# Patient Record
Sex: Male | Born: 1995 | Hispanic: No | Marital: Single | Smoking: Never smoker
Health system: Southern US, Community
[De-identification: ages and names within clinical notes are randomized; demographics above are authoritative.]

## PROBLEM LIST (undated history)

## (undated) ENCOUNTER — Emergency Department (HOSPITAL_COMMUNITY): Admission: EM | Payer: Medicaid Other | Source: Home / Self Care

## (undated) HISTORY — PX: HERNIA REPAIR: SHX51

## (undated) HISTORY — PX: WISDOM TOOTH EXTRACTION: SHX21

---

## 2014-04-02 ENCOUNTER — Emergency Department (HOSPITAL_COMMUNITY)
Admission: EM | Admit: 2014-04-02 | Discharge: 2014-04-02 | Disposition: A | Discharge status: Home / Self Care | Payer: Medicaid Other | Attending: Emergency Medicine | Admitting: Emergency Medicine

## 2014-04-02 ENCOUNTER — Encounter (HOSPITAL_COMMUNITY): Payer: Self-pay | Admitting: Emergency Medicine

## 2014-04-02 DIAGNOSIS — IMO0002 Reserved for concepts with insufficient information to code with codable children: Secondary | ICD-10-CM | POA: Diagnosis not present

## 2014-04-02 DIAGNOSIS — J029 Acute pharyngitis, unspecified: Secondary | ICD-10-CM | POA: Diagnosis not present

## 2014-04-02 DIAGNOSIS — Z79899 Other long term (current) drug therapy: Secondary | ICD-10-CM | POA: Insufficient documentation

## 2014-04-02 LAB — RAPID STREP SCREEN (MED CTR MEBANE ONLY): Streptococcus, Group A Screen (Direct): NEGATIVE

## 2014-04-02 MED ORDER — CLINDAMYCIN HCL 300 MG PO CAPS: 300.0000 mg | ORAL_CAPSULE | Freq: Four times a day (QID) | ORAL | Status: DC

## 2014-04-02 NOTE — ED Provider Notes (Signed)
CSN: [id number]     Arrival date & time 04/02/14  1840 History   None  This chart was scribed for non-physician practitioner Ivonne Andrew PA-C working with Raeford Razor, MD by Gwenevere Abbot, ED scribe. This patient was seen in room WTR6/WTR6 and the patient's care was started at 8:38 PM.    Chief Complaint  Patient presents with  . Sore Throat   HPI HPI Comments:  Colin Martinez is a 18 y.o. male who presents to the Emergency Department complaining of  sore throat, with associated symptoms of cold sweats, cough, ever, and chills, onset 2 days. Pt reports that he has had mono twice, and that he was tested and diagnosed. Pt reports that he has had a lot of issues with his throat. He states that after his wisdom teeth were pulled, his throat became infected. He also reports having trouble with adenoids. Pt states that he has visited HENT specialist and that they said everything was fine.    History reviewed. No pertinent past medical history. Past Surgical History  Procedure Laterality Date  . Hernia repair     No family history on file. History  Substance Use Topics  . Smoking status: Never Smoker   . Smokeless tobacco: Not on file  . Alcohol Use: Yes    Review of Systems  Constitutional: Positive for fever and chills.  HENT: Positive for sore throat. Negative for congestion and rhinorrhea.   Respiratory: Positive for cough.   Gastrointestinal: Negative for nausea, vomiting, diarrhea and constipation.  All other systems reviewed and are negative.     Allergies  Wheat bran  Home Medications   Prior to Admission medications   Medication Sig Start Date End Date Taking? Authorizing Provider  diphenhydrAMINE (BENADRYL) 12.5 MG/5ML liquid Take 25 mg by mouth 4 (four) times daily as needed for itching or allergies.   Yes Historical Provider, MD  ibuprofen (ADVIL,MOTRIN) 200 MG tablet Take 400 mg by mouth every 6 (six) hours as needed for moderate pain.   Yes Historical  Provider, MD  predniSONE (DELTASONE) 20 MG tablet Take 20-40 mg by mouth daily with breakfast. 40 mg daily for 3 days  Then 20 mg daily for 3 days then 10 mg daily for 2 day s    Historical Provider, MD  tobramycin (TOBREX) 0.3 % ophthalmic solution Place 2 drops into the right eye every 4 (four) hours.    Historical Provider, MD   BP 124/75  Pulse 98  Temp(Src) 98.9 F (37.2 C) (Oral)  Resp 18  SpO2 100% Physical Exam  Nursing note and vitals reviewed. Constitutional: He is oriented to person, place, and time. He appears well-developed and well-nourished.  HENT:  Head: Normocephalic.  Tonsils are enlarged with exudate. There is exudate to the posterior pharynx.   Neck: Normal range of motion. Neck supple.  Cardiovascular: Normal rate and regular rhythm.   Pulmonary/Chest: Effort normal and breath sounds normal.  Abdominal: Soft.  Lymphadenopathy:    He has cervical adenopathy.  Neurological: He is alert and oriented to person, place, and time.  Skin: Skin is warm.  Psychiatric: He has a normal mood and affect. His behavior is normal.    ED Course  Procedures  DIAGNOSTIC STUDIES: Oxygen Saturation is 100% on RA, normal by my interpretation.  COORDINATION OF CARE: 8:28 PM-Discussed treatment plan with pt at bedside and pt agreed to plan.  Results for orders placed during the hospital encounter of 04/02/14  RAPID STREP SCREEN  Result Value Ref Range   Streptococcus, Group A Screen (Direct) NEGATIVE  NEGATIVE    MDM   Final diagnoses:  Pharyngitis   I personally performed the services described in this documentation, which was scribed in my presence. The recorded information has been reviewed and is accurate.      Angus Seller, PA-C 04/02/14 2109

## 2014-04-02 NOTE — Discharge Instructions (Signed)
Please follow up with a primary care provider or Ear, Nose and throat specialist for further evaluation and treatment.     Pharyngitis Pharyngitis is a sore throat (pharynx). There is redness, pain, and swelling of your throat. HOME CARE   Drink enough fluids to keep your pee (urine) clear or pale yellow.  Only take medicine as told by your doctor.  You may get sick again if you do not take medicine as told. Finish your medicines, even if you start to feel better.  Do not take aspirin.  Rest.  Rinse your mouth (gargle) with salt water ( tsp of salt per 1 qt of water) every 1-2 hours. This will help the pain.  If you are not at risk for choking, you can suck on hard candy or sore throat lozenges. GET HELP IF:  You have large, tender lumps on your neck.  You have a rash.  You cough up green, yellow-brown, or bloody spit. GET HELP RIGHT AWAY IF:   You have a stiff neck.  You drool or cannot swallow liquids.  You throw up (vomit) or are not able to keep medicine or liquids down.  You have very bad pain that does not go away with medicine.  You have problems breathing (not from a stuffy nose). MAKE SURE YOU:   Understand these instructions.  Will watch your condition.  Will get help right away if you are not doing well or get worse. Document Released: 01/10/2008 Document Revised: 05/14/2013 Document Reviewed: 03/31/2013 [facility] Patient Information 2015 [place], [place]. This information is not intended to replace advice given to you by your health care provider. Make sure you discuss any questions you have with your health care provider.

## 2014-04-02 NOTE — ED Notes (Signed)
Pt reports sore throat and headache x 2 weeks and body aches that started yesterday. "i have had cold chills and i was unable to get out of the bed yesterday". Positive for a cough. Denies n/v/d. Pt has swollen tonsils and a red throat. NAD

## 2014-04-03 NOTE — ED Provider Notes (Signed)
.  Medical screening examination/treatment/procedure(s) were performed by non-physician practitioner and as supervising physician I was immediately available for consultation/collaboration.   EKG Interpretation None       Raeford Razor, MD 04/03/14 [phone]

## 2014-04-04 LAB — CULTURE, GROUP A STREP

## 2014-12-01 ENCOUNTER — Emergency Department (INDEPENDENT_AMBULATORY_CARE_PROVIDER_SITE_OTHER)
Admission: EM | Admit: 2014-12-01 | Discharge: 2014-12-01 | Disposition: A | Discharge status: Home / Self Care | Payer: Medicaid Other | Source: Home / Self Care

## 2014-12-01 ENCOUNTER — Encounter (HOSPITAL_COMMUNITY): Payer: Self-pay | Admitting: Emergency Medicine

## 2014-12-01 ENCOUNTER — Other Ambulatory Visit (HOSPITAL_COMMUNITY)
Admission: RE | Admit: 2014-12-01 | Discharge: 2014-12-01 | Disposition: A | Discharge status: Home / Self Care | Payer: Medicaid Other | Source: Ambulatory Visit | Attending: Family Medicine | Admitting: Family Medicine

## 2014-12-01 DIAGNOSIS — Z202 Contact with and (suspected) exposure to infections with a predominantly sexual mode of transmission: Secondary | ICD-10-CM

## 2014-12-01 DIAGNOSIS — Z113 Encounter for screening for infections with a predominantly sexual mode of transmission: Secondary | ICD-10-CM | POA: Diagnosis present

## 2014-12-01 DIAGNOSIS — Z711 Person with feared health complaint in whom no diagnosis is made: Secondary | ICD-10-CM

## 2014-12-01 NOTE — Discharge Instructions (Signed)
We will call with test results and treat as indicated °

## 2014-12-01 NOTE — ED Provider Notes (Signed)
CSN: [id number]     Arrival date & time 12/01/14  1250 History   None    Chief Complaint  Patient presents with  . Exposure to STD   (Consider location/radiation/quality/duration/timing/severity/associated sxs/prior Treatment) Patient is a 19 y.o. male presenting with STD exposure. The history is provided by the patient.  Exposure to STD This is a new problem. The current episode started yesterday (told yest by girl that she had chlamydia, pt asx and never had any problems prev, here for check.). The problem has not changed since onset.Pertinent negatives include no abdominal pain.    History reviewed. No pertinent past medical history. Past Surgical History  Procedure Laterality Date  . Hernia repair    . Wisdom tooth extraction     History reviewed. No pertinent family history. History  Substance Use Topics  . Smoking status: Never Smoker   . Smokeless tobacco: Never Used  . Alcohol Use: Yes     Comment: social    Review of Systems  Constitutional: Negative.   Gastrointestinal: Negative for abdominal pain.  Genitourinary: Negative for dysuria, discharge, penile swelling, scrotal swelling, penile pain and testicular pain.    Allergies  Wheat bran  Home Medications   Prior to Admission medications   Medication Sig Start Date End Date Taking? Authorizing Provider  clindamycin (CLEOCIN) 300 MG capsule Take 1 capsule (300 mg total) by mouth 4 (four) times daily. X 7 days 04/02/14   Ivonne AndrewPeter Dammen, PA-C  diphenhydrAMINE (BENADRYL) 12.5 MG/5ML liquid Take 25 mg by mouth 4 (four) times daily as needed for itching or allergies.    Historical Provider, MD  ibuprofen (ADVIL,MOTRIN) 200 MG tablet Take 400 mg by mouth every 6 (six) hours as needed for moderate pain.    Historical Provider, MD  predniSONE (DELTASONE) 20 MG tablet Take 20-40 mg by mouth daily with breakfast. 40 mg daily for 3 days  Then 20 mg daily for 3 days then 10 mg daily for 2 day s    Historical Provider, MD   tobramycin (TOBREX) 0.3 % ophthalmic solution Place 2 drops into the right eye every 4 (four) hours.    Historical Provider, MD   BP 131/84 mmHg  Pulse 72  Temp(Src) 98.5 F (36.9 C) (Oral)  SpO2 98% Physical Exam  Constitutional: He is oriented to person, place, and time. He appears well-developed and well-nourished. No distress.  Genitourinary: Penis normal.  Neurological: He is alert and oriented to person, place, and time.  Skin: Skin is warm and dry.  Nursing note and vitals reviewed.   ED Course  Procedures (including critical care time) Labs Review Labs Reviewed  URINE CYTOLOGY ANCILLARY ONLY    Imaging Review No results found.   MDM   1. Concern about STD in male without diagnosis    Pt desires to wait for test results before treatment,    Linna HoffJames D Princess Karnes, MD 12/01/14 [id number]

## 2014-12-01 NOTE — ED Notes (Signed)
Pt was told by his partner that she had Chlamydia.  He is here today to be tested.  He has no symptoms at this time, but he does have what appears to be a cold sore on his left upper lip.

## 2014-12-02 LAB — URINE CYTOLOGY ANCILLARY ONLY
Chlamydia: NEGATIVE
Neisseria Gonorrhea: NEGATIVE
TRICH (WINDOWPATH): NEGATIVE

## 2015-09-30 ENCOUNTER — Emergency Department (HOSPITAL_COMMUNITY)
Admission: EM | Admit: 2015-09-30 | Discharge: 2015-09-30 | Disposition: A | Discharge status: Home / Self Care | Payer: Medicaid Other | Attending: Emergency Medicine | Admitting: Emergency Medicine

## 2015-09-30 ENCOUNTER — Encounter (HOSPITAL_COMMUNITY): Payer: Self-pay | Admitting: *Deleted

## 2015-09-30 ENCOUNTER — Emergency Department (HOSPITAL_COMMUNITY): Payer: Medicaid Other

## 2015-09-30 DIAGNOSIS — B349 Viral infection, unspecified: Secondary | ICD-10-CM | POA: Diagnosis not present

## 2015-09-30 DIAGNOSIS — R509 Fever, unspecified: Secondary | ICD-10-CM | POA: Diagnosis present

## 2015-09-30 DIAGNOSIS — R000 Tachycardia, unspecified: Secondary | ICD-10-CM | POA: Insufficient documentation

## 2015-09-30 DIAGNOSIS — R6889 Other general symptoms and signs: Secondary | ICD-10-CM

## 2015-09-30 DIAGNOSIS — Z792 Long term (current) use of antibiotics: Secondary | ICD-10-CM | POA: Insufficient documentation

## 2015-09-30 DIAGNOSIS — R231 Pallor: Secondary | ICD-10-CM | POA: Insufficient documentation

## 2015-09-30 LAB — RAPID STREP SCREEN (MED CTR MEBANE ONLY): STREPTOCOCCUS, GROUP A SCREEN (DIRECT): NEGATIVE

## 2015-09-30 LAB — I-STAT CG4 LACTIC ACID, ED: Lactic Acid, Venous: 0.89 mmol/L (ref 0.5–2.0)

## 2015-09-30 MED ORDER — OSELTAMIVIR PHOSPHATE 75 MG PO CAPS: 75.0000 mg | ORAL_CAPSULE | Freq: Two times a day (BID) | ORAL | Status: DC

## 2015-09-30 MED ORDER — ONDANSETRON 4 MG PO TBDP: 4.0000 mg | ORAL_TABLET | Freq: Once | ORAL | Status: DC

## 2015-09-30 MED ORDER — ACETAMINOPHEN 325 MG PO TABS: 650.0000 mg | ORAL_TABLET | Freq: Four times a day (QID) | ORAL | Status: DC | PRN

## 2015-09-30 MED ORDER — BENZONATATE 100 MG PO CAPS: 100.0000 mg | ORAL_CAPSULE | Freq: Three times a day (TID) | ORAL | Status: DC

## 2015-09-30 MED ORDER — KETOROLAC TROMETHAMINE 30 MG/ML IJ SOLN
30.0000 mg | Freq: Once | INTRAMUSCULAR | Status: AC
  Administered 2015-09-30: 30 mg via INTRAVENOUS
  Filled 2015-09-30: qty 1

## 2015-09-30 MED ORDER — ACETAMINOPHEN 325 MG PO TABS
650.0000 mg | ORAL_TABLET | Freq: Once | ORAL | Status: AC | PRN
  Administered 2015-09-30: 650 mg via ORAL
  Filled 2015-09-30: qty 2

## 2015-09-30 MED ORDER — ONDANSETRON HCL 4 MG/2ML IJ SOLN
4.0000 mg | Freq: Once | INTRAMUSCULAR | Status: AC
  Administered 2015-09-30: 4 mg via INTRAVENOUS
  Filled 2015-09-30: qty 2

## 2015-09-30 MED ORDER — SODIUM CHLORIDE 0.9 % IV BOLUS (SEPSIS)
1000.0000 mL | Freq: Once | INTRAVENOUS | Status: AC
  Administered 2015-09-30: 1000 mL via INTRAVENOUS

## 2015-09-30 MED ORDER — OSELTAMIVIR PHOSPHATE 75 MG PO CAPS
75.0000 mg | ORAL_CAPSULE | Freq: Once | ORAL | Status: AC
  Administered 2015-09-30: 75 mg via ORAL
  Filled 2015-09-30: qty 1

## 2015-09-30 MED ORDER — IBUPROFEN 800 MG PO TABS: 800.0000 mg | ORAL_TABLET | Freq: Three times a day (TID) | ORAL | Status: DC

## 2015-09-30 NOTE — ED Notes (Signed)
Patient transported to X-ray 

## 2015-09-30 NOTE — ED Provider Notes (Signed)
CSN: [id number]     Arrival date & time 09/30/15  1428 History  By signing my name below, I, Freida Busman, attest that this documentation has been prepared under the direction and in the presence of non-physician practitioner, Noelle Penner, PA-C. Electronically Signed: Freida Busman, Scribe. 09/30/2015. 5:51 PM.    Chief Complaint  Patient presents with  . Sore Throat  . Fever  . Cough  . Emesis   The history is provided by the patient. No language interpreter was used.    HPI Comments:  YOSHIO SELIGA is a 20 y.o. male who presents to the Emergency Department complaining of gradually worsening sore throat since yesterday AM. Pt reports associated chills, fever, dry cough, nausea, and HA. Pt has also had two episodes of NBNB emesis. Endorses nausea in the ED now. Denies abdominal pain or diarrhea. He was evaluated from his symptoms at an OSH ED yesterday where he had a negative strep test and was discharged with nausea med and lidocaine mouthwash, which he has taken with little relief. He has also taken tylenol with mild relief of his fever. Pt did not receive the flu shot this season. Denies sick contacts. Denies chest pain or SOB. States he has not been able to tolerate PO today due to nausea.   History reviewed. No pertinent past medical history. Past Surgical History  Procedure Laterality Date  . Hernia repair    . Wisdom tooth extraction     No family history on file. Social History  Substance Use Topics  . Smoking status: Never Smoker   . Smokeless tobacco: Never Used  . Alcohol Use: Yes     Comment: social    Review of Systems  Constitutional: Positive for fever and chills.  HENT: Positive for sore throat.   Respiratory: Positive for cough. Negative for shortness of breath.   Cardiovascular: Negative for chest pain.  Gastrointestinal: Positive for nausea.  Musculoskeletal: Positive for myalgias.  Neurological: Positive for headaches.  All other systems reviewed and  are negative.   Allergies  Wheat bran  Home Medications   Prior to Admission medications   Medication Sig Start Date End Date Taking? Authorizing Provider  clindamycin (CLEOCIN) 300 MG capsule Take 1 capsule (300 mg total) by mouth 4 (four) times daily. X 7 days 04/02/14   Ivonne Andrew, PA-C  diphenhydrAMINE (BENADRYL) 12.5 MG/5ML liquid Take 25 mg by mouth 4 (four) times daily as needed for itching or allergies.    Historical Provider, MD  ibuprofen (ADVIL,MOTRIN) 200 MG tablet Take 400 mg by mouth every 6 (six) hours as needed for moderate pain.    Historical Provider, MD  predniSONE (DELTASONE) 20 MG tablet Take 20-40 mg by mouth daily with breakfast. 40 mg daily for 3 days  Then 20 mg daily for 3 days then 10 mg daily for 2 day s    Historical Provider, MD  tobramycin (TOBREX) 0.3 % ophthalmic solution Place 2 drops into the right eye every 4 (four) hours.    Historical Provider, MD   BP 106/55 mmHg  Pulse 97  Temp(Src) 98.9 F (37.2 C) (Oral)  Resp 20  SpO2 96% Physical Exam  Constitutional: He is oriented to person, place, and time. He appears ill. No distress.  HENT:  Head: Normocephalic and atraumatic.  Right Ear: External ear normal.  Left Ear: External ear normal.  Nose: Nose normal.  Mouth/Throat: Mucous membranes are dry. No trismus in the jaw. Posterior oropharyngeal erythema present. No oropharyngeal exudate or  posterior oropharyngeal edema.  Eyes: Conjunctivae are normal. No scleral icterus.  Neck: Normal range of motion. Neck supple. No Brudzinski's sign and no Kernig's sign noted.  Cardiovascular: Regular rhythm and normal heart sounds.  Tachycardia present.   Pulmonary/Chest: Effort normal and breath sounds normal. No respiratory distress. He has no wheezes.  Abdominal: Soft. He exhibits no distension. There is no tenderness.  Lymphadenopathy:    He has no cervical adenopathy.  Neurological: He is alert and oriented to person, place, and time.  Skin: Skin is  warm and dry. He is not diaphoretic. There is pallor.  Psychiatric: He has a normal mood and affect.  Nursing note and vitals reviewed.  Filed Vitals:   09/30/15 1722 09/30/15 1815 09/30/15 1908 09/30/15 2016  BP: 92/51 109/51 100/51 106/55  Pulse: 99 120 100 97  Temp: 103.1 F (39.5 C) 99.4 F (37.4 C) 98.7 F (37.1 C) 98.9 F (37.2 C)  TempSrc: Oral Oral  Oral  Resp: SpO2: 95% 95% 97% 96%     ED Course  Procedures   DIAGNOSTIC STUDIES:  Oxygen Saturation is 95% on RA, normal by my interpretation.    COORDINATION OF CARE:  4:35 PM Will order tamiflu, zofran, toradol and IV fluids. Discussed treatment plan with pt at bedside and pt agreed to plan.  Labs Review Labs Reviewed  RAPID STREP SCREEN (NOT AT [facility])  CULTURE, GROUP A STREP ([facility])  I-STAT CG4 LACTIC ACID, ED    Imaging Review Dg Chest 2 View  09/30/2015  CLINICAL DATA:  Cough and fever EXAM: CHEST  2 VIEW COMPARISON:  None. FINDINGS: Lungs are clear. Heart size and pulmonary vascularity are normal. No adenopathy. No bone lesions. IMPRESSION: No edema or consolidation. Electronically Signed   By: Bretta Bang III M.D.   On: 09/30/2015 17:23   I have personally reviewed and evaluated these images and lab results as part of my medical decision-making.   MDM   Final diagnoses:  Flu-like symptoms  Viral syndrome  Fever, unspecified fever cause    Pt is an 20 y.o. otherwise healthy male who presents to the ED for evaluation of fever, sore throat, cough, body aches, n/v. HE was initially febrile and tachycardic, both of which have improved over the course of his ED stay. His rapid strep was negative, CXR showed no evidence of pneumonia or other acute abnormality. Lactic acid 0.89. I suspect that pt has the flu. He was started empirically on Tamiflu in the ED and given tylenol, toradol, zofran, and 2 x 1L NS bolus. He is tolerating PO and subjectively feels improved. He is afebrile, HR improved to  the 90s. At this time pt is stable for discharge for outpatient management and f/u. Pt was seen and evaluated by attending MD Madilyn Hook who also agrees with the plan. Rx given for tamiflu, tylenol, motrin, and tessalon. He has zofran at home. Resource guide given to establish PCP. ER return precautions given.   I personally performed the services described in this documentation, which was scribed in my presence. The recorded information has been reviewed and is accurate.   Carlene Coria, PA-C 10/01/15 7829  Tilden Fossa, MD 10/02/15 [phone]

## 2015-09-30 NOTE — Discharge Instructions (Signed)
You were seen in the ER today for evaluation of fever, chills, nausea, sore throat, cough, and body aches. You most likely have a virus, probably the flu. I will give you a prescription for Tamiflu. We gave you the first dose in the ER. I will also give you several prescriptions to help with your fever, aches, and cough. You may also continue taking the nausea medicine you have at home as needed.   Take medications as prescribed. Return to the emergency room for worsening condition or new concerning symptoms. Follow up with your regular doctor. If you don't have a regular doctor use one of the numbers below to establish a primary care doctor.   Emergency Department Resource Guide 1) Find a Doctor and Pay Out of Pocket Although you won't have to find out who is covered by your insurance plan, it is a good idea to ask around and get recommendations. You will then need to call the office and see if the doctor you have chosen will accept you as a new patient and what types of options they offer for patients who are self-pay. Some doctors offer discounts or will set up payment plans for their patients who do not have insurance, but you will need to ask so you aren't surprised when you get to your appointment.  2) Contact Your Local Health Department Not all health departments have doctors that can see patients for sick visits, but many do, so it is worth a call to see if yours does. If you don't know where your local health department is, you can check in your phone book. The CDC also has a tool to help you locate your state's health department, and many state websites also have listings of all of their local health departments.  3) Find a Walk-in Clinic If your illness is not likely to be very severe or complicated, you may want to try a walk in clinic. These are popping up all over the country in pharmacies, drugstores, and shopping centers. They're usually staffed by nurse practitioners or physician  assistants that have been trained to treat common illnesses and complaints. They're usually fairly quick and inexpensive. However, if you have serious medical issues or chronic medical problems, these are probably not your best option.  No Primary Care Doctor: - Call Health Connect at  [phone] - they can help you locate a primary care doctor that  accepts your insurance, provides certain services, etc. - Physician Referral [phone]  Emergency Department Resource Guide 1) Find a Doctor and Pay Out of Pocket Although you won't have to find out who is covered by your insurance plan, it is a good idea to ask around and get recommendations. You will then need to call the office and see if the doctor you have chosen will accept you as a new patient and what types of options they offer for patients who are self-pay. Some doctors offer discounts or will set up payment plans for their patients who do not have insurance, but you will need to ask so you aren't surprised when you get to your appointment.  2) Contact Your Local Health Department Not all health departments have doctors that can see patients for sick visits, but many do, so it is worth a call to see if yours does. If you don't know where your local health department is, you can check in your phone book. The CDC also has a tool to help you locate your state's health department, and  many state websites also have listings of all of their local health departments.  3) Find a Walk-in Clinic If your illness is not likely to be very severe or complicated, you may want to try a walk in clinic. These are popping up all over the country in pharmacies, drugstores, and shopping centers. They're usually staffed by nurse practitioners or physician assistants that have been trained to treat common illnesses and complaints. They're usually fairly quick and inexpensive. However, if you have serious medical issues or chronic medical problems, these are  probably not your best option.  No Primary Care Doctor: - Call Health Connect at  [phone] - they can help you locate a primary care doctor that  accepts your insurance, provides certain services, etc. - Physician Referral Service- [phone]  Chronic Pain Problems: Organization         Address  Phone   Notes  [facility]  [phone] Patients need to be referred by their primary care doctor.   Medication Assistance: Organization         Address  Phone   Notes  [facility] Medication [facility] [address] [place], [place] [address] [phone] --Must be a resident of [facility] -- Must have NO insurance coverage whatsoever (no Medicaid/ Medicare, etc.) -- The pt. MUST have a primary care doctor that directs their care regularly and follows them in the community   [facility]  [phone]   [employer]  [phone]    Agencies that provide inexpensive medical care: Organization         Address  Phone   Notes  [facility]  [phone]   [facility]    [phone]   [facility] [address] [place], [place] [address] [phone]   [facility] [address] [place], [place] [phone]   [facility]    [phone]   [facility]    [phone]   [facility]  [address], [place] Phone:  [phone], Fax:  [phone] Hours of Operation:  9 am - 6 pm, M-F.  Also accepts Medicaid/Medicare and self-pay.  [facility]  [address], [place] Phone: [phone], Fax: [phone]. Hours of Operation:  8:30 am - 5:30 pm, M-F.  Also accepts Medicaid and self-pay.  [facility] [place] [address] [place] Phone: [phone]   [facility] [address] Natasha Bence [place], [place] [phone], Ext. [phone] Mondays & Thursdays:  7-9 AM.  First 15 patients are seen on a first come, first serve basis.    Medicaid-accepting [facility] Providers:  Organization         Address  Phone   Notes  [facility] [address], [place] [phone] Also accepts self-pay patients.  [facility] [address] Laurell Josephs [place], [place]  [phone]   [facility] [address], [place] [phone]   [facility] [address] [place] [phone]   Renaye Rakers [address], [place]   [phone] Only accepts [place] patients after they have their name applied to their card.   Self-Pay (no insurance) in [facility]:  Organization  Address  Phone   Notes  Sickle Cell Patients, [facility] [address] [place], [place] [phone]   [facility] [address] [place], [place] [phone]   [facility]  [address], [place] [phone]   [facility]/Dr. Osei-Bonsu  [address] [place] or [address], [place] [phone] Phone number for both [place] and [place] locations is the same.  [facility] [address] [place] [phone]   [facility] [address], [place] or [address] [phone]   [facility] [address], [place] [phone], phone; [phone], fax Sees patients 1st and 3rd Saturday of every month.  Must not qualify for public or private insurance (i.e. Medicaid, Medicare, [place] Health Choice, Veterans' Benefits)  Household income should be no more than 200% of the poverty level The clinic cannot treat you if you are pregnant or think you are pregnant  Sexually transmitted diseases are not treated at the clinic.

## 2015-09-30 NOTE — Progress Notes (Addendum)
Pt taken to the x-ray dept. Tolerated IV placement and NSS up one liter. Pt stated his symptoms started yesterday. He c/o high fevers , nausea and a cough with production but he is unsure of the color. Pt does not appear short of breath. He does c/o chills and was instructed not to wrap up in blankets. Pt is pleasant and cooperative . 5:20pm Pt returned from CXR dept. Tolerated well. PA aware of recent vital signs. Pt given a pitcher of juice. Ice pack placed under pts. Right armpit. Lung sounds clear in all fields.(5:30pm )7pm Lactic acid drawn on the pt.-pt tolerated well. EDP in with the pt. Pt stated he felt better. Second liter bag up. Infusing w/o difficulty.

## 2015-09-30 NOTE — ED Notes (Addendum)
Pt complains of sore throat, cough fever since yesterday. Pt states he went to doctor yesterday and was told he was negative for strep throat. Pt states he last took tylenol last night. Pt states he made himself throw up after feeling nauseas yesterday. Pt threw up in triage after strep screen swab

## 2015-10-02 LAB — CULTURE, GROUP A STREP (THRC)

## 2015-10-03 ENCOUNTER — Telehealth (HOSPITAL_COMMUNITY): Payer: Self-pay

## 2015-10-03 NOTE — Telephone Encounter (Signed)
Post ED Visit - Positive Culture Follow-up: Chart Hand-off to ED Flow Manager  Culture assessed and recommendations reviewed by:  Isaac Bliss, Pharm.D., BCPS  Celedonio Miyamoto, Pharm.D., BCPS-AQ ID  Georgina Pillion, Pharm.D., BCPS  [place], [address].D., BCPS, AAHIVP  Estella Husk, Pharm .D., BCPS, AAHIVP  Tennis Must, Pharm.D.  Casilda Carls, Pharm.DCarmon Sails [place] pharm D Positive strep culture   Patient discharged without antimicrobial prescription and treatment is now indicated  Organism is resistant to prescribed ED discharge antimicrobial  Patient with positive blood cultures  Changes discussed with ED provider: Cheri Fowler PA New antibiotic prescription if symptomatic give amoxicillin  po bid x 10 days  Letter sent to address on file.    Ashley Jacobs 10/03/2015, 5:16 PM

## 2015-10-03 NOTE — Progress Notes (Signed)
ED Antimicrobial Stewardship Positive Culture Follow Up   Colin Martinez is an 20 y.o. male who presented to [facility] on 09/30/2015 with a chief complaint of  Chief Complaint  Patient presents with  . Sore Throat  . Fever  . Cough  . Emesis    Recent Results (from the past 720 hour(s))  Rapid strep screen (not at [facility])     Status: None   Collection Time: 09/30/15  3:04 PM  Result Value Ref Range Status   Streptococcus, Group A Screen (Direct) NEGATIVE NEGATIVE Final    Comment: (NOTE) A Rapid Antigen test may result negative if the antigen level in the sample is below the detection level of this test. The FDA has not cleared this test as a stand-alone test therefore the rapid antigen negative result has reflexed to a Group A Strep culture.   Culture, group A strep     Status: None   Collection Time: 09/30/15  3:04 PM  Result Value Ref Range Status   Specimen Description THROAT  Final   Special Requests NONE Reflexed from [id number]  Final   Culture   Final    FEW STREPTOCOCCUS,BETA HEMOLYTIC NOT GROUP A Performed at [facility]    Report Status 10/02/2015 FINAL  Final     Treated with , organism resistant to prescribed antimicrobial  Patient discharged originally without antimicrobial agent and treatment is now indicated  New antibiotic prescription: If strep symptoms, amoxicillin  PO BID x 10 days  ED Provider: Cheri Fowler, PA   [name], Drake Leach 10/03/2015, 10:21 AM Clinical Pharmacist Phone# [phone]

## 2016-04-02 IMAGING — DX DG CHEST 2V
2 series · 2 of 2 positions shown · non-contrast
Comparison: None.

CLINICAL DATA: Cough and fever

EXAM:
CHEST  2 VIEW

[chest pa]
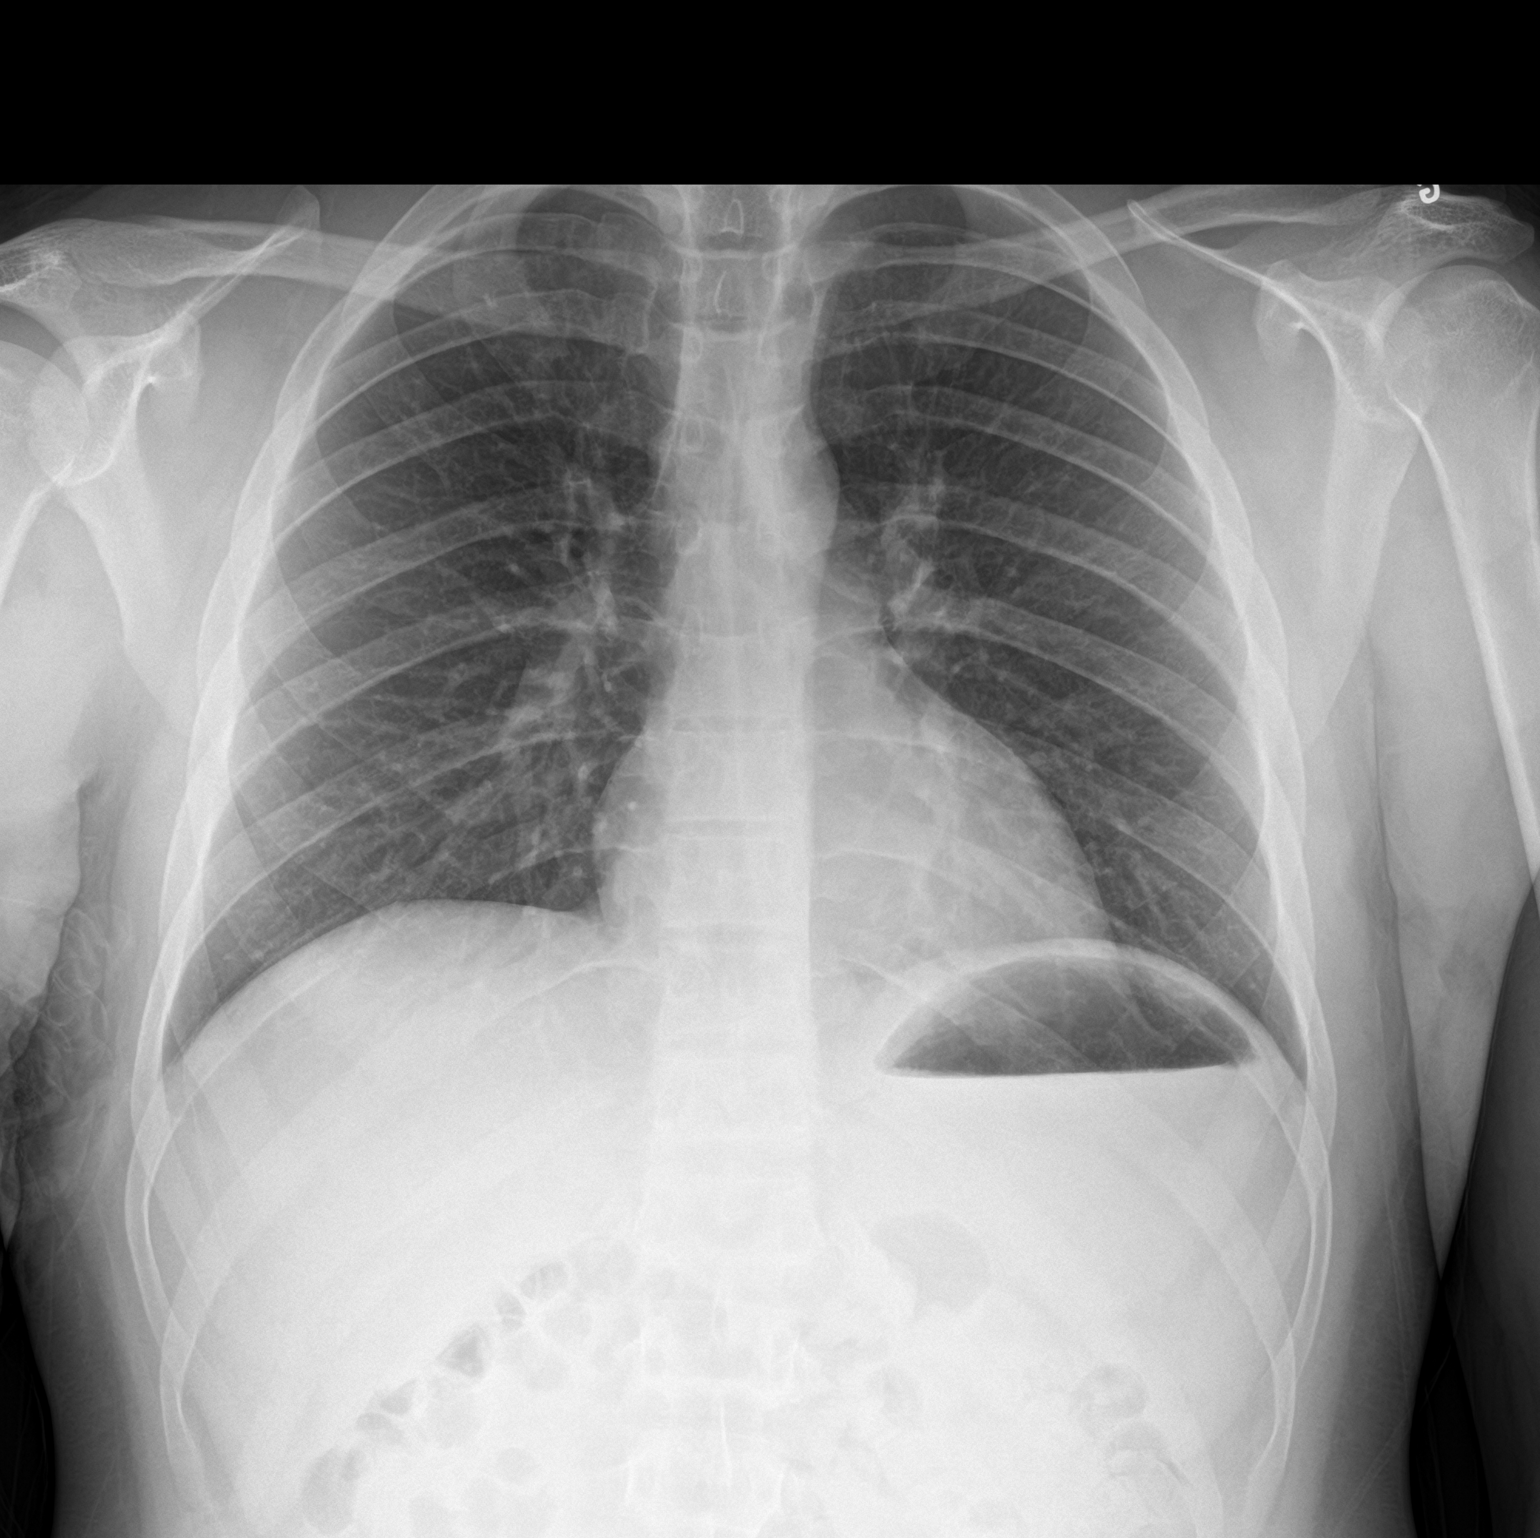

[chest lat]
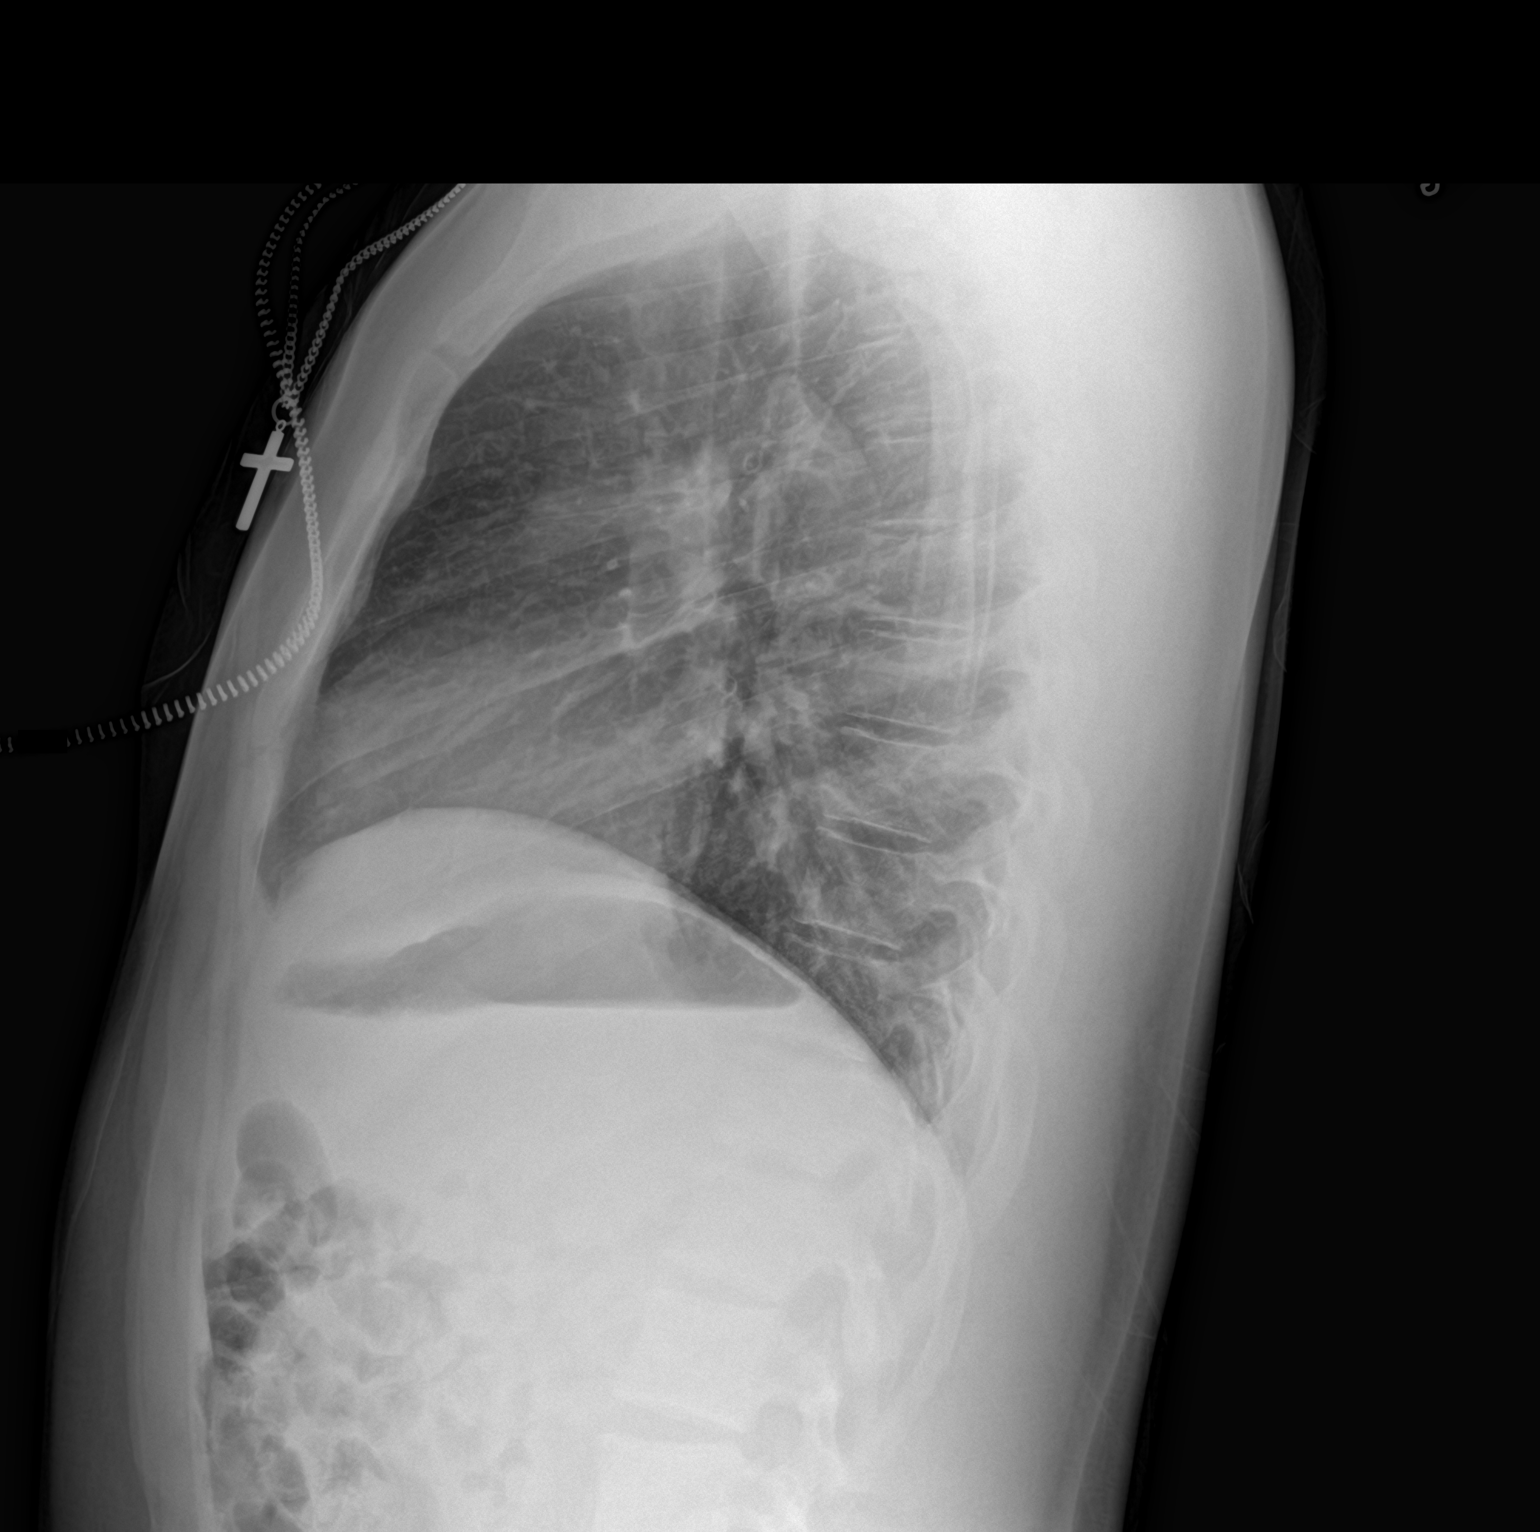

[2 of 2 positions shown; findings below may reference images not displayed]

FINDINGS: Lungs are clear. Heart size and pulmonary vascularity are normal. No
adenopathy. No bone lesions.
IMPRESSION: No edema or consolidation.

## 2016-04-10 ENCOUNTER — Encounter (HOSPITAL_COMMUNITY): Payer: Self-pay | Admitting: Emergency Medicine

## 2016-04-10 ENCOUNTER — Emergency Department (HOSPITAL_COMMUNITY)
Admission: EM | Admit: 2016-04-10 | Discharge: 2016-04-10 | Disposition: A | Discharge status: Home / Self Care | Payer: BLUE CROSS/BLUE SHIELD | Attending: Emergency Medicine | Admitting: Emergency Medicine

## 2016-04-10 DIAGNOSIS — R21 Rash and other nonspecific skin eruption: Secondary | ICD-10-CM | POA: Diagnosis not present

## 2016-04-10 DIAGNOSIS — K2970 Gastritis, unspecified, without bleeding: Secondary | ICD-10-CM | POA: Insufficient documentation

## 2016-04-10 DIAGNOSIS — R112 Nausea with vomiting, unspecified: Secondary | ICD-10-CM | POA: Diagnosis present

## 2016-04-10 LAB — COMPREHENSIVE METABOLIC PANEL
ALT: 31 U/L (ref 17–63)
AST: 29 U/L (ref 15–41)
Albumin: 4.9 g/dL (ref 3.5–5.0)
Alkaline Phosphatase: 68 U/L (ref 38–126)
Anion gap: 12 (ref 5–15)
BUN: 8 mg/dL (ref 6–20)
CHLORIDE: 100 mmol/L — AB (ref 101–111)
CO2: 28 mmol/L (ref 22–32)
Calcium: 10.3 mg/dL (ref 8.9–10.3)
Creatinine, Ser: 0.99 mg/dL (ref 0.61–1.24)
GFR calc Af Amer: >60 mL/min (ref >60)
GFR calc non Af Amer: >60 mL/min (ref >60)
Glucose, Bld: 101 mg/dL — ABNORMAL HIGH (ref 65–99)
Potassium: 4 mmol/L (ref 3.5–5.1)
SODIUM: 140 mmol/L (ref 135–145)
Total Bilirubin: 1.5 mg/dL — ABNORMAL HIGH (ref 0.3–1.2)
Total Protein: 8.5 g/dL — ABNORMAL HIGH (ref 6.5–8.1)

## 2016-04-10 LAB — CBC WITH DIFFERENTIAL/PLATELET
BASOS ABS: 0 10*3/uL (ref 0.0–0.1)
BASOS PCT: 0 %
EOS ABS: 0.1 10*3/uL (ref 0.0–0.7)
EOS PCT: 0 %
HCT: 46.9 % (ref 39.0–52.0)
Hemoglobin: 15.5 g/dL (ref 13.0–17.0)
Lymphocytes Relative: 5 %
Lymphs Abs: 1 10*3/uL (ref 0.7–4.0)
MCH: 31.9 pg (ref 26.0–34.0)
MCHC: 33 g/dL (ref 30.0–36.0)
MCV: 96.5 fL (ref 78.0–100.0)
MONO ABS: 1 10*3/uL (ref 0.1–1.0)
Monocytes Relative: 5 %
Neutro Abs: 16.3 10*3/uL — ABNORMAL HIGH (ref 1.7–7.7)
Neutrophils Relative %: 90 %
PLATELETS: 326 10*3/uL (ref 150–400)
RBC: 4.86 MIL/uL (ref 4.22–5.81)
RDW: 11.7 % (ref 11.5–15.5)
WBC: 18.3 10*3/uL — AB (ref 4.0–10.5)

## 2016-04-10 LAB — MAGNESIUM: Magnesium: 1.9 mg/dL (ref 1.7–2.4)

## 2016-04-10 LAB — LIPASE, BLOOD: Lipase: 22 U/L (ref 11–51)

## 2016-04-10 MED ORDER — ONDANSETRON HCL 4 MG/2ML IJ SOLN: 4.0000 mg | Freq: Once | INTRAMUSCULAR | Status: DC

## 2016-04-10 MED ORDER — METOCLOPRAMIDE HCL 5 MG/ML IJ SOLN
10.0000 mg | Freq: Once | INTRAMUSCULAR | Status: AC
  Administered 2016-04-10: 10 mg via INTRAVENOUS
  Filled 2016-04-10: qty 2

## 2016-04-10 MED ORDER — ONDANSETRON 4 MG PO TBDP: 4.0000 mg | ORAL_TABLET | Freq: Three times a day (TID) | ORAL | 0 refills | Status: DC | PRN

## 2016-04-10 MED ORDER — KETOROLAC TROMETHAMINE 30 MG/ML IJ SOLN
30.0000 mg | Freq: Once | INTRAMUSCULAR | Status: AC
  Administered 2016-04-10: 30 mg via INTRAVENOUS
  Filled 2016-04-10: qty 1

## 2016-04-10 MED ORDER — SODIUM CHLORIDE 0.9 % IV BOLUS (SEPSIS)
2000.0000 mL | Freq: Once | INTRAVENOUS | Status: AC
  Administered 2016-04-10: 2000 mL via INTRAVENOUS

## 2016-04-10 NOTE — ED Provider Notes (Signed)
[facility]-EMERGENCY DEPT Provider Note   CSN: [id number] Arrival date & time: 04/10/16  0243  By signing my name below, I, Rosario AdieWilliam Andrew Hiatt, attest that this documentation has been prepared under the direction and in the presence of Tomasita CrumbleAdeleke Siddh Vandeventer, MD. Electronically Signed: Rosario AdieWilliam Andrew Hiatt, ED Scribe. 04/10/16. 3:27 AM.  History   Chief Complaint Chief Complaint  Patient presents with  . Emesis  . Nasal Congestion   The history is provided by the patient. No language interpreter was used.   HPI Comments: Mercy RidingChristopher A Lowman is a 20 y.o. male who presents to the Emergency Department complaining of intermittent episodes of nausea w/ associated NBNB emesis x ~2 days. He reports associated chills, subjective fever, nasal congestion, and headaches since the onset of his nausea and vomiting. Pt reports that just prior to the onset of his symptoms, he drank a smoothie containing whey protein which he has never ingested before and may be allergic to. His nausea is exacerbated with food or fluid intake. No alleviating factors noted. No sick contact with similar symptoms. Denies diarrhea, abdominal pain, or any other associated symptoms.   History reviewed. No pertinent past medical history.  There are no active problems to display for this patient.  Past Surgical History:  Procedure Laterality Date  . HERNIA REPAIR    . WISDOM TOOTH EXTRACTION      Home Medications    Prior to Admission medications   Medication Sig Start Date End Date Taking? Authorizing Provider  acetaminophen (TYLENOL) 325 MG tablet Take 2 tablets (650 mg total) by mouth every 6 (six) hours as needed for fever. 09/30/15   Ace GinsSerena Y Sam, PA-C  benzonatate (TESSALON) 100 MG capsule Take 1 capsule (100 mg total) by mouth every 8 (eight) hours. 09/30/15   Ace GinsSerena Y Sam, PA-C  clindamycin (CLEOCIN) 300 MG capsule Take 1 capsule (300 mg total) by mouth 4 (four) times daily. X 7 days 04/02/14   Ivonne AndrewPeter Dammen, PA-C  diphenhydrAMINE  (BENADRYL) 12.5 MG/5ML liquid Take 25 mg by mouth 4 (four) times daily as needed for itching or allergies.    Historical Provider, MD  ibuprofen (ADVIL,MOTRIN) 200 MG tablet Take 400 mg by mouth every 6 (six) hours as needed for moderate pain.    Historical Provider, MD  ibuprofen (ADVIL,MOTRIN) 800 MG tablet Take 1 tablet (800 mg total) by mouth 3 (three) times daily. 09/30/15   Carlene CoriaSerena Y Sam, PA-C  oseltamivir (TAMIFLU) 75 MG capsule Take 1 capsule (75 mg total) by mouth every 12 (twelve) hours. 09/30/15   Ace GinsSerena Y Sam, PA-C  predniSONE (DELTASONE) 20 MG tablet Take 20-40 mg by mouth daily with breakfast. 40 mg daily for 3 days  Then 20 mg daily for 3 days then 10 mg daily for 2 day s    Historical Provider, MD  tobramycin (TOBREX) 0.3 % ophthalmic solution Place 2 drops into the right eye every 4 (four) hours.    Historical Provider, MD   Family History No family history on file.  Social History Social History  Substance Use Topics  . Smoking status: Never Smoker  . Smokeless tobacco: Never Used  . Alcohol use Yes     Comment: social   Allergies   Wheat bran and Whey protein [protein]  Review of Systems Review of Systems A complete 10 system review of systems was obtained and all systems are negative except as noted in the HPI and PMH.   Physical Exam Updated Vital Signs BP (!) 104/51  Pulse 93   Temp 99.3 F (37.4 C) (Oral)   Resp 17   Ht 5\' 8"  (1.727 m)   Wt 190 lb (86.2 kg)   SpO2 97%   BMI 28.89 kg/m   Physical Exam  Constitutional: He is oriented to person, place, and time. Vital signs are normal. He appears well-developed and well-nourished.  Non-toxic appearance. He does not appear ill. No distress.  HENT:  Head: Normocephalic and atraumatic.  Nose: Nose normal.  Mouth/Throat: Oropharynx is clear and moist. No oropharyngeal exudate.  Eyes: Conjunctivae and EOM are normal. Pupils are equal, round, and reactive to light. No scleral icterus.  Neck: Normal range of  motion. Neck supple. No tracheal deviation, no edema, no erythema and normal range of motion present. No thyroid mass and no thyromegaly present.  Cardiovascular: Regular rhythm, S1 normal, S2 normal, normal heart sounds, intact distal pulses and normal pulses.  Tachycardia present.  Exam reveals no gallop and no friction rub.   No murmur heard. Pulmonary/Chest: Effort normal and breath sounds normal. No respiratory distress. He has no wheezes. He has no rhonchi. He has no rales.  Abdominal: Soft. Normal appearance and bowel sounds are normal. He exhibits no distension, no ascites and no mass. There is no hepatosplenomegaly. There is no tenderness. There is no rebound, no guarding and no CVA tenderness.  Musculoskeletal: Normal range of motion. He exhibits no edema or tenderness.  Lymphadenopathy:    He has no cervical adenopathy.  Neurological: He is alert and oriented to person, place, and time. He has normal strength. No cranial nerve deficit or sensory deficit.  Skin: Skin is warm, dry and intact. Rash noted. No petechiae noted. He is not diaphoretic. No erythema. No pallor.  Erythematous, blanching rash to his anterior chest wall. No TTP. Tactile fever noted.   Nursing note and vitals reviewed.  ED Treatments / Results  DIAGNOSTIC STUDIES: Oxygen Saturation is 100% on RA, normal by my interpretation.   COORDINATION OF CARE: 3:14 AM-Discussed next steps with pt. Pt verbalized understanding and is agreeable with the plan.   Labs (all labs ordered are listed, but only abnormal results are displayed) Labs Reviewed  CBC WITH DIFFERENTIAL/PLATELET - Abnormal; Notable for the following:       Result Value   WBC 18.3 (*)    Neutro Abs 16.3 (*)    All other components within normal limits  COMPREHENSIVE METABOLIC PANEL - Abnormal; Notable for the following:    Chloride 100 (*)    Glucose, Bld 101 (*)    Total Protein 8.5 (*)    Total Bilirubin 1.5 (*)    All other components within  normal limits  LIPASE, BLOOD  MAGNESIUM   EKG  EKG Interpretation None      Radiology No results found.  Procedures Procedures (including critical care time)  Medications Ordered in ED Medications  sodium chloride 0.9 % bolus 2,000 mL (0 mLs Intravenous Stopped 04/10/16 0516)  metoCLOPramide (REGLAN) injection 10 mg (10 mg Intravenous Given 04/10/16 0344)  ketorolac (TORADOL) 30 MG/ML injection 30 mg (30 mg Intravenous Given 04/10/16 0344)   Initial Impression / Assessment and Plan / ED Course  I have reviewed the triage vital signs and the nursing notes.  Pertinent labs & imaging results that were available during my care of the patient were reviewed by me and considered in my medical decision making (see chart for details).  Clinical Course    Patient presents to the ED for N/V all day  today.  He was given toradol and reglan for his symptoms, as well as IVF.  Labs obtained and revealed an elevated WBC, which can be seen with vomiting.  Abdomen is benign, likely not representing an acute abdominal infection.  5:47 AM Upon repeat evaluation.  PAtietn feels much better and safe to go home.  Will give zofran to take as needed.  PCP fu advised within 3 days. He appears well and in NAD. VSremain within his normal limits and he is safe for DC.  Final Clinical Impressions(s) / ED Diagnoses   Final diagnoses:  None    New Prescriptions New Prescriptions   No medications on file   I personally performed the services described in this documentation, which was scribed in my presence. The recorded information has been reviewed and is accurate.       Tomasita Crumble, MD 04/10/16 [phone]

## 2016-04-10 NOTE — ED Triage Notes (Signed)
Pt c/o n/v x 2 days, nasal congestion and cough.

## 2016-11-24 ENCOUNTER — Ambulatory Visit (HOSPITAL_COMMUNITY)
Admission: EM | Admit: 2016-11-24 | Discharge: 2016-11-24 | Disposition: A | Discharge status: Home / Self Care | Payer: BLUE CROSS/BLUE SHIELD | Attending: Internal Medicine | Admitting: Internal Medicine

## 2016-11-24 ENCOUNTER — Encounter (HOSPITAL_COMMUNITY): Payer: Self-pay | Admitting: Emergency Medicine

## 2016-11-24 DIAGNOSIS — A084 Viral intestinal infection, unspecified: Secondary | ICD-10-CM

## 2016-11-24 MED ORDER — GI COCKTAIL ~~LOC~~
ORAL | Status: AC
  Filled 2016-11-24: qty 30

## 2016-11-24 MED ORDER — OMEPRAZOLE 40 MG PO CPDR: 40.0000 mg | DELAYED_RELEASE_CAPSULE | Freq: Two times a day (BID) | ORAL | 0 refills | Status: AC

## 2016-11-24 MED ORDER — LOPERAMIDE HCL 2 MG PO CAPS: 2.0000 mg | ORAL_CAPSULE | Freq: Four times a day (QID) | ORAL | 0 refills | Status: AC | PRN

## 2016-11-24 MED ORDER — GI COCKTAIL ~~LOC~~
30.0000 mL | Freq: Once | ORAL | Status: AC
  Administered 2016-11-24: 30 mL via ORAL

## 2016-11-24 MED ORDER — DICYCLOMINE HCL 20 MG PO TABS
20.0000 mg | ORAL_TABLET | Freq: Two times a day (BID) | ORAL | 0 refills | Status: AC
Start: 2016-11-24 — End: ?

## 2016-11-24 MED ORDER — ONDANSETRON 4 MG PO TBDP: 4.0000 mg | ORAL_TABLET | Freq: Three times a day (TID) | ORAL | 0 refills | Status: AC | PRN

## 2016-11-24 MED ORDER — ONDANSETRON 4 MG PO TBDP
ORAL_TABLET | ORAL | Status: AC
  Filled 2016-11-24: qty 2

## 2016-11-24 MED ORDER — ONDANSETRON 4 MG PO TBDP
8.0000 mg | ORAL_TABLET | Freq: Once | ORAL | Status: AC
  Administered 2016-11-24: 8 mg via ORAL

## 2016-11-24 NOTE — ED Triage Notes (Signed)
PT reports he drank a smoothie last night with " a half a cup" of alcohol in it. PT reports he's vomited 3 times this morning. PT reports blood in his vomit. PT reports "clots" in the vomit. PT reports the smoothie last night was pink/red in color. PT reports central abdominal pain that feels like "something is eating me."

## 2016-11-24 NOTE — ED Provider Notes (Signed)
CSN: [id number]     Arrival date & time 11/24/16  1107 History   First MD Initiated Contact with Patient 11/24/16 1153     Chief Complaint  Patient presents with  . Emesis   (Consider location/radiation/quality/duration/timing/severity/associated sxs/prior Treatment) The history is provided by the patient.  Emesis  Severity:  Moderate Duration:  1 day Timing:  Constant Number of daily episodes:  4-6 Quality:  Stomach contents Able to tolerate:  Liquids Progression:  Worsening Chronicity:  New Recent urination:  Normal Context: not post-tussive and not self-induced   Ineffective treatments:  None tried Associated symptoms: abdominal pain and diarrhea   Associated symptoms: no chills, no cough, no headaches, no myalgias and no sore throat   Diarrhea:    Quality:  Copious and watery   Number of occurrences:  5-8   Severity:  Moderate   Duration:  1 day   Timing:  Constant   Progression:  Worsening Risk factors: no alcohol use, no sick contacts, no suspect food intake and no travel to endemic areas     History reviewed. No pertinent past medical history. Past Surgical History:  Procedure Laterality Date  . HERNIA REPAIR    . WISDOM TOOTH EXTRACTION     No family history on file. Social History  Substance Use Topics  . Smoking status: Never Smoker  . Smokeless tobacco: Never Used  . Alcohol use Yes     Comment: social    Review of Systems  Constitutional: Negative for chills and fatigue.  HENT: Negative for congestion, sinus pain, sinus pressure and sore throat.   Respiratory: Negative for cough and shortness of breath.   Cardiovascular: Negative.   Gastrointestinal: Positive for abdominal pain, diarrhea and vomiting.  Musculoskeletal: Negative for myalgias, neck pain and neck stiffness.  Neurological: Negative for light-headedness and headaches.    Allergies  Wheat bran and Whey protein [protein]  Home Medications   Prior to Admission medications    Medication Sig Start Date End Date Taking? Authorizing Provider  dicyclomine (BENTYL) 20 MG tablet Take 1 tablet (20 mg total) by mouth 2 (two) times daily. 11/24/16   Dorena Bodo, NP  loperamide (IMODIUM) 2 MG capsule Take 1 capsule (2 mg total) by mouth 4 (four) times daily as needed for diarrhea or loose stools. 11/24/16   Dorena Bodo, NP  omeprazole (PRILOSEC) 40 MG capsule Take 1 capsule (40 mg total) by mouth 2 (two) times daily. 11/24/16 12/08/16  Dorena Bodo, NP  ondansetron (ZOFRAN ODT) 4 MG disintegrating tablet Take 1 tablet (4 mg total) by mouth every 8 (eight) hours as needed for nausea or vomiting. 11/24/16   Dorena Bodo, NP   Meds Ordered and Administered this Visit   Medications  ondansetron (ZOFRAN-ODT) disintegrating tablet 8 mg (8 mg Oral Given 11/24/16 1212)  gi cocktail (Maalox,Lidocaine,Donnatal) (30 mLs Oral Given 11/24/16 1213)    BP 136/64   Pulse 74   Temp 98.1 F (36.7 C) (Oral)   Resp 16   Ht  (1.753 m)   Wt 170 lb (77.1 kg)   SpO2 98%   BMI 25.10 kg/m  No data found.   Physical Exam  Constitutional: He is oriented to person, place, and time. He appears well-developed and well-nourished. No distress.  HENT:  Head: Normocephalic and atraumatic.  Right Ear: External ear normal.  Left Ear: External ear normal.  Eyes: Conjunctivae are normal. Right eye exhibits no discharge. Left eye exhibits no discharge.  Cardiovascular: Normal rate and regular rhythm.  Pulmonary/Chest: Effort normal and breath sounds normal.  Abdominal: Soft. Normal appearance. There is no hepatosplenomegaly. There is tenderness in the epigastric area. There is no rebound, no tenderness at McBurney's point and negative Murphy's sign.  Neurological: He is alert and oriented to person, place, and time.  Skin: Skin is warm. Capillary refill takes less than 2 seconds. He is not diaphoretic.  Psychiatric: He has a normal mood and affect. His behavior is normal.  Nursing  note and vitals reviewed.   Urgent Care Course     Procedures (including critical care time)  Labs Review Labs Reviewed - No data to display  Imaging Review No results found.    MDM   1. Viral gastroenteritis    Given Zofran, Bentyl, Loperamide, and Prilosec. Advised clear liquid diet, given strict follow up guidelines. Return to clinic as needed.     Dorena Bodo, NP 11/24/16 [phone]

## 2016-11-24 NOTE — Discharge Instructions (Signed)
You have viral gastritis. I prescribed medications to help your symptoms.For Nausea, I have prescribed Zofran, take 1 tablet under the tongue every 8 hours as needed. For diarrhea, prescribed loperamide, take one tablet after each loose stool, not to exceed 4 tablets in any 24 hour period. I prescribed a medicine called Bentyl, take one tablet by mouth twice daily. And finally have prescribed a medicine called omeprazole, take 1 tablet by mouth daily. Should your symptoms persist, or fail to improve, follow up with your primary care provider or return to clinic as needed. If your symptoms worsen, particularly if you develop worsened abdominal pain, fever, signs or symptoms of severe dehydration, then go to the emergency room.  °
